# Patient Record
Sex: Female | Born: 1960 | Race: Black or African American | Hispanic: No | Marital: Single | Smoking: Never smoker
Health system: Southern US, Community
[De-identification: ages and names within clinical notes are randomized; demographics above are authoritative.]

## PROBLEM LIST (undated history)

## (undated) DIAGNOSIS — E079 Disorder of thyroid, unspecified: Secondary | ICD-10-CM

## (undated) DIAGNOSIS — R202 Paresthesia of skin: Principal | ICD-10-CM

## (undated) DIAGNOSIS — R200 Anesthesia of skin: Principal | ICD-10-CM

## (undated) HISTORY — DX: Paresthesia of skin: R20.2

## (undated) HISTORY — PX: ABDOMINAL HYSTERECTOMY: SUR658

## (undated) HISTORY — PX: THYROIDECTOMY: SHX17

## (undated) HISTORY — DX: Anesthesia of skin: R20.0

---

## 1998-01-18 ENCOUNTER — Ambulatory Visit (HOSPITAL_COMMUNITY): Admission: RE | Admit: 1998-01-18 | Discharge: 1998-01-18 | Payer: Self-pay | Admitting: Obstetrics and Gynecology

## 1998-01-18 ENCOUNTER — Encounter: Payer: Self-pay | Admitting: Obstetrics and Gynecology

## 1998-10-10 ENCOUNTER — Ambulatory Visit (HOSPITAL_COMMUNITY): Admission: RE | Admit: 1998-10-10 | Discharge: 1998-10-10 | Payer: Self-pay | Admitting: Internal Medicine

## 1998-10-11 ENCOUNTER — Encounter: Payer: Self-pay | Admitting: Internal Medicine

## 2001-09-15 ENCOUNTER — Encounter: Payer: Self-pay | Admitting: Obstetrics and Gynecology

## 2001-09-15 ENCOUNTER — Ambulatory Visit (HOSPITAL_COMMUNITY): Admission: RE | Admit: 2001-09-15 | Discharge: 2001-09-15 | Payer: Self-pay | Admitting: Obstetrics and Gynecology

## 2002-01-13 ENCOUNTER — Ambulatory Visit (HOSPITAL_COMMUNITY): Admission: RE | Admit: 2002-01-13 | Discharge: 2002-01-13 | Payer: Self-pay | Admitting: Obstetrics and Gynecology

## 2002-01-13 ENCOUNTER — Encounter: Payer: Self-pay | Admitting: Obstetrics and Gynecology

## 2002-12-10 ENCOUNTER — Other Ambulatory Visit: Admission: RE | Admit: 2002-12-10 | Discharge: 2002-12-10 | Payer: Self-pay | Admitting: Internal Medicine

## 2002-12-16 ENCOUNTER — Encounter: Payer: Self-pay | Admitting: Obstetrics and Gynecology

## 2002-12-16 ENCOUNTER — Ambulatory Visit (HOSPITAL_COMMUNITY): Admission: RE | Admit: 2002-12-16 | Discharge: 2002-12-16 | Payer: Self-pay | Admitting: Obstetrics and Gynecology

## 2003-01-07 ENCOUNTER — Ambulatory Visit (HOSPITAL_BASED_OUTPATIENT_CLINIC_OR_DEPARTMENT_OTHER): Admission: RE | Admit: 2003-01-07 | Discharge: 2003-01-07 | Payer: Self-pay | Admitting: Obstetrics and Gynecology

## 2013-12-09 ENCOUNTER — Other Ambulatory Visit: Payer: Self-pay | Admitting: Family Medicine

## 2013-12-09 DIAGNOSIS — E042 Nontoxic multinodular goiter: Secondary | ICD-10-CM

## 2013-12-20 ENCOUNTER — Ambulatory Visit
Admission: RE | Admit: 2013-12-20 | Discharge: 2013-12-20 | Disposition: A | Discharge status: Home / Self Care | Payer: 59 | Source: Ambulatory Visit | Attending: Family Medicine | Admitting: Family Medicine

## 2013-12-20 DIAGNOSIS — E042 Nontoxic multinodular goiter: Secondary | ICD-10-CM

## 2014-01-04 ENCOUNTER — Other Ambulatory Visit: Payer: Self-pay | Admitting: Family Medicine

## 2014-01-04 DIAGNOSIS — E041 Nontoxic single thyroid nodule: Secondary | ICD-10-CM

## 2014-01-13 ENCOUNTER — Encounter (INDEPENDENT_AMBULATORY_CARE_PROVIDER_SITE_OTHER): Payer: Self-pay

## 2014-01-13 ENCOUNTER — Other Ambulatory Visit (HOSPITAL_COMMUNITY)
Admission: RE | Admit: 2014-01-13 | Discharge: 2014-01-13 | Disposition: A | Discharge status: Home / Self Care | Payer: 59 | Source: Ambulatory Visit | Attending: Interventional Radiology | Admitting: Interventional Radiology

## 2014-01-13 ENCOUNTER — Ambulatory Visit
Admission: RE | Admit: 2014-01-13 | Discharge: 2014-01-13 | Disposition: A | Discharge status: Home / Self Care | Payer: 59 | Source: Ambulatory Visit | Attending: Family Medicine | Admitting: Family Medicine

## 2014-01-13 DIAGNOSIS — E041 Nontoxic single thyroid nodule: Secondary | ICD-10-CM | POA: Diagnosis not present

## 2014-04-19 ENCOUNTER — Other Ambulatory Visit: Payer: Self-pay | Admitting: Otolaryngology

## 2014-04-19 DIAGNOSIS — R4702 Dysphasia: Secondary | ICD-10-CM

## 2014-04-21 ENCOUNTER — Ambulatory Visit
Admission: RE | Admit: 2014-04-21 | Discharge: 2014-04-21 | Disposition: A | Discharge status: Home / Self Care | Payer: 59 | Source: Ambulatory Visit | Attending: Otolaryngology | Admitting: Otolaryngology

## 2014-04-21 DIAGNOSIS — R4702 Dysphasia: Secondary | ICD-10-CM

## 2014-05-12 ENCOUNTER — Other Ambulatory Visit: Payer: Self-pay | Admitting: Otolaryngology

## 2014-05-12 DIAGNOSIS — E049 Nontoxic goiter, unspecified: Secondary | ICD-10-CM

## 2014-12-16 ENCOUNTER — Emergency Department (HOSPITAL_COMMUNITY)
Admission: EM | Admit: 2014-12-16 | Discharge: 2014-12-17 | Disposition: A | Discharge status: Home / Self Care | Payer: Commercial Managed Care - HMO | Attending: Emergency Medicine | Admitting: Emergency Medicine

## 2014-12-16 ENCOUNTER — Emergency Department (HOSPITAL_COMMUNITY): Payer: Commercial Managed Care - HMO

## 2014-12-16 ENCOUNTER — Encounter (HOSPITAL_COMMUNITY): Payer: Self-pay | Admitting: *Deleted

## 2014-12-16 DIAGNOSIS — Z880 Allergy status to penicillin: Secondary | ICD-10-CM | POA: Diagnosis not present

## 2014-12-16 DIAGNOSIS — R200 Anesthesia of skin: Secondary | ICD-10-CM | POA: Diagnosis present

## 2014-12-16 DIAGNOSIS — Z8639 Personal history of other endocrine, nutritional and metabolic disease: Secondary | ICD-10-CM | POA: Diagnosis not present

## 2014-12-16 DIAGNOSIS — G510 Bell's palsy: Secondary | ICD-10-CM | POA: Insufficient documentation

## 2014-12-16 HISTORY — DX: Disorder of thyroid, unspecified: E07.9

## 2014-12-16 LAB — DIFFERENTIAL
BASOS PCT: 0 % (ref 0–1)
Basophils Absolute: 0 10*3/uL (ref 0.0–0.1)
EOS ABS: 0 10*3/uL (ref 0.0–0.7)
EOS PCT: 0 % (ref 0–5)
Lymphocytes Relative: 31 % (ref 12–46)
Lymphs Abs: 3 10*3/uL (ref 0.7–4.0)
Monocytes Absolute: 0.5 10*3/uL (ref 0.1–1.0)
Monocytes Relative: 6 % (ref 3–12)
Neutro Abs: 6 10*3/uL (ref 1.7–7.7)
Neutrophils Relative %: 63 % (ref 43–77)

## 2014-12-16 LAB — COMPREHENSIVE METABOLIC PANEL
ALBUMIN: 3.8 g/dL (ref 3.5–5.0)
ALT: 17 U/L (ref 14–54)
ANION GAP: 7 (ref 5–15)
AST: 17 U/L (ref 15–41)
Alkaline Phosphatase: 48 U/L (ref 38–126)
BILIRUBIN TOTAL: 0.6 mg/dL (ref 0.3–1.2)
BUN: 11 mg/dL (ref 6–20)
CO2: 27 mmol/L (ref 22–32)
Calcium: 9.1 mg/dL (ref 8.9–10.3)
Chloride: 106 mmol/L (ref 101–111)
Creatinine, Ser: 1.33 mg/dL — ABNORMAL HIGH (ref 0.44–1.00)
GFR calc non Af Amer: 45 mL/min — ABNORMAL LOW (ref >60)
GFR, EST AFRICAN AMERICAN: 52 mL/min — AB (ref >60)
GLUCOSE: 103 mg/dL — AB (ref 65–99)
POTASSIUM: 3.9 mmol/L (ref 3.5–5.1)
Sodium: 140 mmol/L (ref 135–145)
Total Protein: 6.7 g/dL (ref 6.5–8.1)

## 2014-12-16 LAB — CBC
HEMATOCRIT: 44.2 % (ref 36.0–46.0)
HEMOGLOBIN: 14.6 g/dL (ref 12.0–15.0)
MCH: 28.5 pg (ref 26.0–34.0)
MCHC: 33 g/dL (ref 30.0–36.0)
MCV: 86.2 fL (ref 78.0–100.0)
PLATELETS: 275 10*3/uL (ref 150–400)
RBC: 5.13 MIL/uL — AB (ref 3.87–5.11)
RDW: 14.6 % (ref 11.5–15.5)
WBC: 9.5 10*3/uL (ref 4.0–10.5)

## 2014-12-16 LAB — I-STAT TROPONIN, ED: Troponin i, poc: 0 ng/mL (ref 0.00–0.08)

## 2014-12-16 MED ORDER — VALACYCLOVIR HCL 1 G PO TABS: 1000.0000 mg | ORAL_TABLET | Freq: Three times a day (TID) | ORAL | Status: DC

## 2014-12-16 MED ORDER — VALACYCLOVIR HCL 500 MG PO TABS
1000.0000 mg | ORAL_TABLET | Freq: Once | ORAL | Status: AC
  Administered 2014-12-16: 1000 mg via ORAL
  Filled 2014-12-16: qty 2

## 2014-12-16 NOTE — ED Notes (Signed)
Patient returned from MRI.

## 2014-12-16 NOTE — ED Notes (Signed)
Pt refused IV at this time

## 2014-12-16 NOTE — ED Provider Notes (Signed)
CSN: [id number]     Arrival date & time 12/16/14  1548 History   First MD Initiated Contact with Patient 12/16/14 2009     Chief Complaint  Patient presents with  . Numbness     (Consider location/radiation/quality/duration/timing/severity/associated sxs/prior Treatment) HPI   54 year old female with history of thyroid disease presents for evaluation of facial numbness. Patient was sent here from [place] at [facility]. Patient report 10 days ago after eating a peanut sauce patient notice small bumps and discomfort on her tongue and the right side of her lip. She thought it may be an allergic reaction. Patient was seen at an outside clinic and received prednisone 20 mg for 4 days plus Depo-Medrol 80 mg injection. Symptoms did not improve and this morning she was having some trouble swallowing with increase R side lip swelling and numbness sensation to the right side of her tongue and R cheek.  The symptom has been persistent but not worsening. She feels some improvement. She denies having any fever, chills, headache, double vision, hearing changes, facial droop, difficulty thinking, neck stiffness, chest pain, trouble breathing, abdominal pain, dysuria, focal numbness or weakness aside from her facial discomfort. She is not a drinker or smoker. Remote history of penicillin allergy. No other environmental changes. No history of prior stroke. She had her thyroid removed last January and currently on levothyroxine.    Past Medical History  Diagnosis Date  . Thyroid disease    History reviewed. No pertinent past surgical history. History reviewed. No pertinent family history. History  Substance Use Topics  . Smoking status: Not on file  . Smokeless tobacco: Not on file  . Alcohol Use: No   OB History    No data available     Review of Systems  All other systems reviewed and are negative.     Allergies  Penicillins  Home Medications   Prior to Admission medications   Not on  File   BP 131/84 mmHg  Pulse 75  Temp(Src) 97.9 F (36.6 C) (Oral)  Resp 20  SpO2 97% Physical Exam  Constitutional: She is oriented to person, place, and time. She appears well-developed and well-nourished. No distress.  HENT:  Head: Atraumatic.  Right Ear: External ear normal.  Left Ear: External ear normal.  Nose: Nose normal.  Mouth/Throat: Oropharynx is clear and moist. No oropharyngeal exudate.  Eyes: Conjunctivae and EOM are normal. Pupils are equal, round, and reactive to light.  Neck: Normal range of motion. Neck supple. No thyromegaly present.  No nuchal rigidity.  Cardiovascular: Normal rate and regular rhythm.   Pulmonary/Chest: Effort normal and breath sounds normal. No stridor.  Abdominal: Soft. Bowel sounds are normal.  Lymphadenopathy:    She has no cervical adenopathy.  Neurological: She is alert and oriented to person, place, and time.  Neurologic exam:  Speech clear, pupils equal round reactive to light, extraocular movements intact  Normal peripheral visual fields Cranial nerves III through XII normal including no facial droop.  Subjective decreased sensation to R cheek compare to left. Follows commands, moves all extremities x4, normal strength to bilateral upper and lower extremities at all major muscle groups including grip Sensation normal to light touch  Coordination intact, no limb ataxia, finger-nose-finger normal Rapid alternating movements normal No pronator drift Gait normal   Skin: No rash noted.  Psychiatric: She has a normal mood and affect.  Nursing note and vitals reviewed.   ED Course  Procedures (including critical care time)  Patient complain of  subjective mild paresthesia to her right cheek and right side of tongue, last normal last night. No other focal neuro deficit on exam. Low risk factors concerning for stroke. No signs to suggest angioedema or allergic reaction on exam. Patient is mentating appropriately. Plan to obtain brain  MRI to rule out stroke.  10:28 PM Labs are reassuring.  Mild renal insufficiency with Cr 1.33.  Brain MRI without acute abnormalities concerning for acute stroke.  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  I discussed care with Dr. Manus Gunning who have also evaluated pt.  We suspect early Bell's palsy.  Since pt has been on steroid for the past 10 days, plan to prescribe valtrex 1,000mg  PO TID x 10 days.  Pt to f/u with PCP.  Return precaution discussed.    Labs Review Labs Reviewed  CBC - Abnormal; Notable for the following:    RBC 5.13 (*)    All other components within normal limits  COMPREHENSIVE METABOLIC PANEL - Abnormal; Notable for the following:    Glucose, Bld 103 (*)    Creatinine, Ser 1.33 (*)    GFR calc non Af Amer 45 (*)    GFR calc Af Amer 52 (*)    All other components within normal limits  DIFFERENTIAL  Rosezena Sensor, ED    Imaging Review Mr Brain Wo Contrast  12/16/2014   CLINICAL DATA:  RIGHT facial paresthesias. Recent allergic reaction treated with prednisone. Intramuscular injection received 2 days ago. No neuro deficits.  EXAM: MRI HEAD WITHOUT CONTRAST  TECHNIQUE: Multiplanar, multiecho pulse sequences of the brain and surrounding structures were obtained without intravenous contrast.  COMPARISON:  None.  FINDINGS: The ventricles and sulci are normal for patient's age. No abnormal parenchymal signal, mass lesions, mass effect. No reduced diffusion to suggest acute ischemia. Multiple small LEFT greater than RIGHT old cerebellar infarcts. A few supratentorial subcentimeter white matter T2 hyperintensities. No susceptibility artifact to suggest hemorrhage.  No abnormal extra-axial fluid collections. No extra-axial masses though, contrast enhanced sequences would be more sensitive. Normal major intracranial vascular flow voids seen at the skull base.  Ocular globes and orbital contents are unremarkable though not tailored for evaluation. No abnormal sellar  expansion. Visualized paranasal sinuses and mastoid air cells are well-aerated. No suspicious calvarial bone marrow signal. No abnormal sellar expansion. Craniocervical junction maintained.  IMPRESSION: No acute intracranial process.  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 Mild white matter changes compatible with chronic small vessel ischemic disease.   Electronically Signed   By: Awilda Metro M.D.   On: 12/16/2014 22:14     EKG Interpretation   Date/Time:  Friday December 16 2014 16:24:58 EDT Ventricular Rate:  69 PR Interval:  164 QRS Duration: 84 QT Interval:  372 QTC Calculation: 398 R Axis:   83 Text Interpretation:  Normal sinus rhythm Nonspecific T wave abnormality  Abnormal ECG No previous ECGs available Confirmed by Manus Gunning  MD, STEPHEN  [phone]) on 12/16/2014 10:27:02 PM      MDM   Final diagnoses:  Paresis of one side of face    BP 131/89 mmHg  Pulse 73  Temp(Src) 97.7 F (36.5 C) (Oral)  Resp 16  SpO2 98%  I have reviewed nursing notes and vital signs. I personally viewed the imaging tests through PACS system and agrees with radiologist's intepretation I reviewed available ER/hospitalization records through the EMR     Fayrene Helper, PA-C 12/16/14 2327  Glynn Octave, MD 12/17/14 [phone]

## 2014-12-16 NOTE — Discharge Instructions (Signed)
Your facial numbness may be signs of early Bell's palsy.  Please take valtrex three times daily for the next 10 days.  Return to ER if your condition worsen or if you have other concerns.  Otherwise please follow up with your doctor.  Bell's Palsy Bell's palsy is a condition in which the muscles on one side of the face cannot move (paralysis). This is because the nerves in the face are paralyzed. It is most often thought to be caused by a virus. The virus causes swelling of the nerve that controls movement on one side of the face. The nerve travels through a tight space surrounded by bone. When the nerve swells, it can be compressed by the bone. This results in damage to the protective covering around the nerve. This damage interferes with how the nerve communicates with the muscles of the face. As a result, it can cause weakness or paralysis of the facial muscles.  Injury (trauma), tumor, and surgery may cause Bell's palsy, but most of the time the cause is unknown. It is a relatively common condition. It starts suddenly (abrupt onset) with the paralysis usually ending within 2 days. Bell's palsy is not dangerous. But because the eye does not close properly, you may need care to keep the eye from getting dry. This can include splinting (to keep the eye shut) or moistening with artificial tears. Bell's palsy very seldom occurs on both sides of the face at the same time. SYMPTOMS   Eyebrow sagging.  Drooping of the eyelid and corner of the mouth.  Inability to close one eye.  Loss of taste on the front of the tongue.  Sensitivity to loud noises. TREATMENT  The treatment is usually non-surgical. If the patient is seen within the first 24 to 48 hours, a short course of steroids may be prescribed, in an attempt to shorten the length of the condition. Antiviral medicines may also be used with the steroids, but it is unclear if they are helpful.  You will need to protect your eye, if you cannot close it.  The cornea (clear covering over your eye) will become dry and can be damaged. Artificial tears can be used to keep your eye moist. Glasses or an eye patch should be worn to protect your eye. PROGNOSIS  Recovery is variable, ranging from days to months. Although the problem usually goes away completely (about 80% of cases resolve), predicting the outcome is impossible. Most people improve within 3 weeks of when the symptoms began. Improvement may continue for 3 to 6 months. A small number of people have moderate to severe weakness that is permanent.  HOME CARE INSTRUCTIONS   If your caregiver prescribed medication to reduce swelling in the nerve, use as directed. Do not stop taking the medication unless directed by your caregiver.  Use moisturizing eye drops as needed to prevent drying of your eye, as directed by your caregiver.  Protect your eye, as directed by your caregiver.  Use facial massage and exercises, as directed by your caregiver.  Perform your normal activities, and get your normal rest. SEEK IMMEDIATE MEDICAL CARE IF:   There is pain, redness or irritation in the eye.  You or your child has an oral temperature above 102 F (38.9 C), not controlled by medicine. MAKE SURE YOU:   Understand these instructions.  Will watch your condition.  Will get help right away if you are not doing well or get worse. Document Released: 04/29/2005 Document Revised: 07/22/2011 Document Reviewed:  08/06/2013 ExitCare Patient Information 2015 [place], [place]. This information is not intended to replace advice given to you by your health care provider. Make sure you discuss any questions you have with your health care provider.

## 2014-12-16 NOTE — ED Notes (Signed)
Pt reports recent allergic reaction and has been treated with prednisone and had [facility]. Today woke up with right side facial numbness. Pt has no neuro deficits at triage, no facial droop, grips ar equal, speech is clear.

## 2014-12-30 ENCOUNTER — Encounter: Payer: Self-pay | Admitting: Neurology

## 2014-12-30 ENCOUNTER — Ambulatory Visit (INDEPENDENT_AMBULATORY_CARE_PROVIDER_SITE_OTHER): Payer: Commercial Managed Care - HMO | Admitting: Neurology

## 2014-12-30 VITALS — BP 121/84 | HR 75 | Ht 66.0 in | Wt 178.0 lb

## 2014-12-30 DIAGNOSIS — R200 Anesthesia of skin: Secondary | ICD-10-CM

## 2014-12-30 DIAGNOSIS — R202 Paresthesia of skin: Secondary | ICD-10-CM | POA: Diagnosis not present

## 2014-12-30 HISTORY — DX: Anesthesia of skin: R20.2

## 2014-12-30 HISTORY — DX: Anesthesia of skin: R20.0

## 2014-12-30 NOTE — Progress Notes (Signed)
Reason for visit: Facial numbness  Referring physician: Caney  Elizabeth Kent is a 54 y.o. female  History of present illness:  Elizabeth Kent is a 54 year old right-handed black female with a history of food allergies. On 12/08/2014 she was eating peanuts, and she noted some sensation of numbness on the right cheek and lip. She has developed a sensation of swelling on the right cheek, and some painful swellings on the right tongue. As time has gone on, this has generalized to include both sides. The patient did go to the emergency room, MRI the brain was done and showed no acute changes. The patient has not noted any other symptoms of numbness or weakness of the extremities, she denies any gait disturbance or difficulty controlling the bowels or the bladder. The patient will have occasional headache. The patient has had some chills. She has a sensation that it is somewhat difficult to swallow, she denies any swollen lymph nodes. She has not had any skin rashes on the hands, feet, or body. The patient is sent to this office for an evaluation.  Past Medical History  Diagnosis Date  . Thyroid disease   . Numbness and tingling 12/30/2014    Past Surgical History  Procedure Laterality Date  . Thyroidectomy    . Abdominal hysterectomy      Family History  Problem Relation Age of Onset  . Hypertension Mother   . Kidney disease Mother     dialysis  . Cancer Father   . Hypertension Maternal Grandmother     Social history:  reports that she has never smoked. She has never used smokeless tobacco. She reports that she drinks alcohol. She reports that she does not use illicit drugs.  Medications:  Prior to Admission medications   Medication Sig Start Date End Date Taking? Authorizing Provider  diphenhydrAMINE (BENADRYL) 25 mg capsule Take 25 mg by mouth every 6 (six) hours as needed.   Yes Historical Provider, MD  EPINEPHrine 0.3 mg/0.3 mL IJ SOAJ injection Inject 0.3 mg into the muscle  once.   Yes Historical Provider, MD  levocetirizine (XYZAL) 5 MG tablet Take 5 mg by mouth daily. 12/14/14  Yes Historical Provider, MD  levothyroxine (SYNTHROID, LEVOTHROID) 112 MCG tablet Take 112 mcg by mouth daily. 12/16/14  Yes Historical Provider, MD      Allergies  Allergen Reactions  . Other     Nuts   . Penicillins   . Shellfish Allergy     ROS:  Out of a complete 14 system review of symptoms, the patient complains only of the following symptoms, and all other reviewed systems are negative.  Chills Palpitations of the heart Ringing of the ears, difficulty swallowing Increased thirst Food allergies Numbness, difficulty swallowing  Blood pressure 121/84, pulse 75, height 5\' 6"  (1.676 m), weight 178 lb (80.74 kg).  Physical Exam  General: The patient is alert and cooperative at the time of the examination.  Eyes: Pupils are equal, round, and reactive to light. Discs are flat bilaterally.  Ears: Tympanic membranes are clear bilaterally  Throat: No redness, erythema, or coating on the tongue is noted. No enlarged lymph nodes are noted on the neck.  Neck: The neck is supple, no carotid bruits are noted.  Respiratory: The respiratory examination is clear.  Cardiovascular: The cardiovascular examination reveals a regular rate and rhythm, no obvious murmurs or rubs are noted.  Skin: Extremities are without significant edema.  Neurologic Exam  Mental status: The patient is  alert and oriented x 3 at the time of the examination. The patient has apparent normal recent and remote memory, with an apparently normal attention span and concentration ability.  Cranial nerves: Facial symmetry is present. There is good sensation of the face to pinprick and soft touch bilaterally. The strength of the facial muscles and the muscles to head turning and shoulder shrug are normal bilaterally. Speech is well enunciated, no aphasia or dysarthria is noted. Extraocular movements are full.  Visual fields are full. The tongue is midline, and the patient has symmetric elevation of the soft palate. No obvious hearing deficits are noted.  Motor: The motor testing reveals 5 over 5 strength of all 4 extremities. Good symmetric motor tone is noted throughout.  Sensory: Sensory testing is intact to pinprick, soft touch, vibration sensation, and position sense on all 4 extremities. No evidence of extinction is noted.  Coordination: Cerebellar testing reveals good finger-nose-finger and heel-to-shin bilaterally.  Gait and station: Gait is normal. Tandem gait is normal. Romberg is negative. No drift is seen.  Reflexes: Deep tendon reflexes are symmetric and normal bilaterally. Toes are downgoing bilaterally.   MRI brain 12/16/14:  IMPRESSION: No acute intracranial process.  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 Mild white matter changes compatible with chronic small vessel ischemic disease.  * MRI scan images were reviewed online. I agree with the written report.    Assessment/Plan:  1. Reported facial numbness  The patient at this point has had resolution of the numbness sensation, she mainly complains of painful nodules on the tongue, and sensation of swelling of the cheeks. The patient may have a viral syndrome, possibly with a coxsackievirus. The patient be sent for blood work today, she will follow-up through this office on an as-needed basis. I see no evidence of a Bell's palsy or other primary neurologic disease.  Marlan Palau MD 01/01/2015 3:28 PM  [facility] [address] [place], [place] [address]  Phone [phone] Fax [phone]

## 2015-01-01 LAB — SEDIMENTATION RATE: Sed Rate: 2 mm/hr (ref 0–40)

## 2015-01-01 LAB — ANA W/REFLEX: Anti Nuclear Antibody(ANA): NEGATIVE

## 2015-01-01 LAB — ANGIOTENSIN CONVERTING ENZYME: Angio Convert Enzyme: 28 U/L (ref 14–82)

## 2015-01-02 ENCOUNTER — Telehealth: Payer: Self-pay

## 2015-01-02 NOTE — Telephone Encounter (Signed)
I called the patient and relayed results. 

## 2015-01-02 NOTE — Telephone Encounter (Signed)
-----   Message from York Spaniel, MD sent at 01/01/2015  3:01 PM EDT -----  The blood work results are unremarkable. Please call the patient.  ----- Message -----    From: [facility] Lab Results In Interface    Sent: 12/31/2014   7:43 AM      To: York Spaniel, MD

## 2017-02-17 IMAGING — MR MR HEAD W/O CM
9 of 10 series · 34 of 48 positions shown · non-contrast
Comparison: None.

CLINICAL DATA: RIGHT facial paresthesias. Recent allergic reaction
treated with prednisone. Intramuscular injection received 2 days
ago. No neuro deficits.

EXAM:
MRI HEAD WITHOUT CONTRAST
TECHNIQUE: Multiplanar, multiecho pulse sequences of the brain and surrounding
structures were obtained without intravenous contrast.

[Series 2: FLAIR · sagittal · 5.0mm · 0.47mm/px · 3 of 23 slices shown (1 of 2)]
[im 1/23]
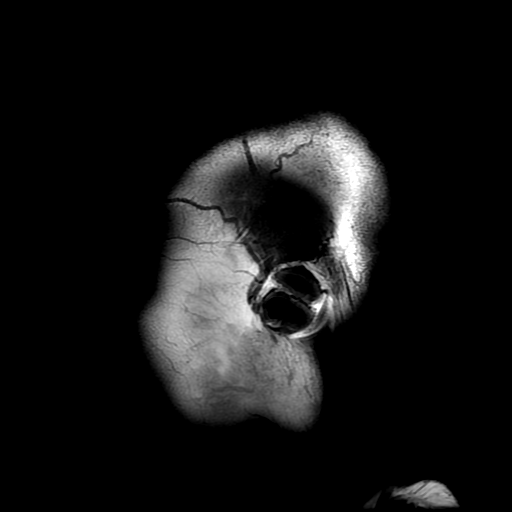
[im 12/23]
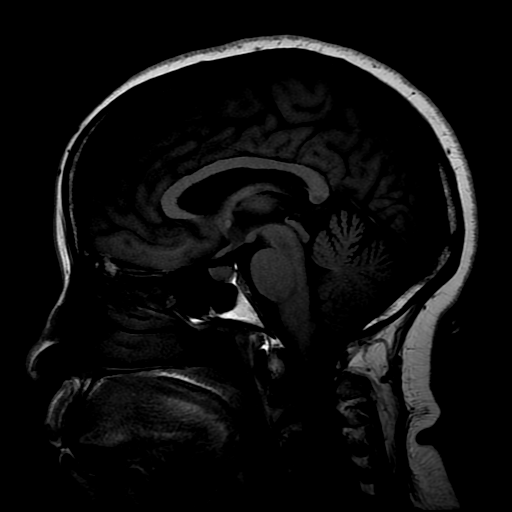
[im 23/23]
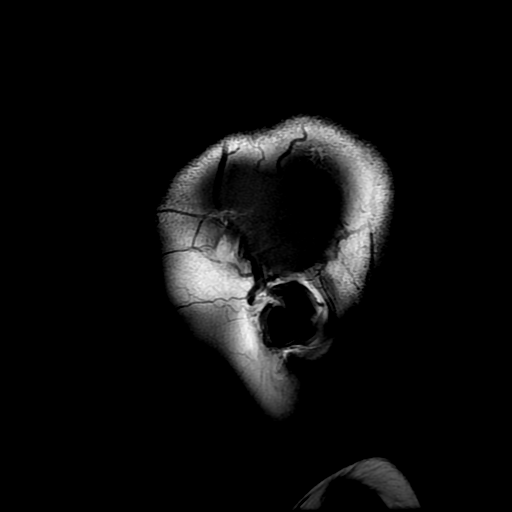

[Series 4: DWI · axial · 3.0mm · 0.94mm/px · z∈[-118,+24]mm · 9 of 99 slices shown (1 of 4)]
[im 1/99]
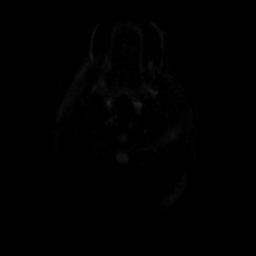
[im 13/99]
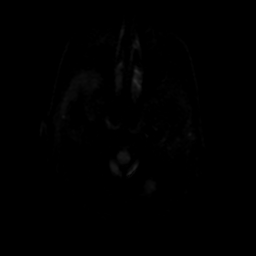
[im 25/99]
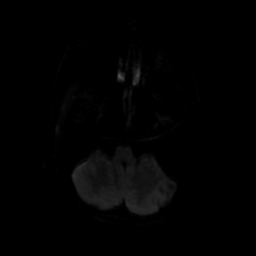
[im 37/99]
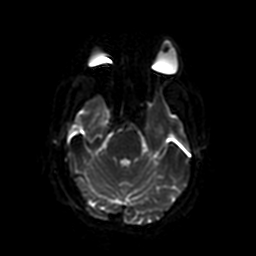
[im 50/99]
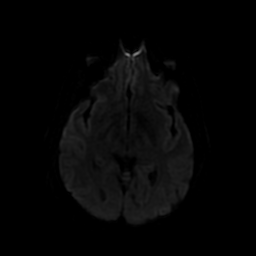
[im 62/99]
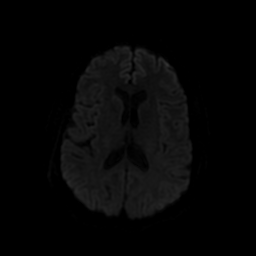
[im 74/99]
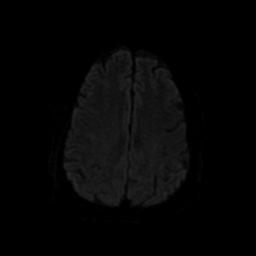
[im 86/99]
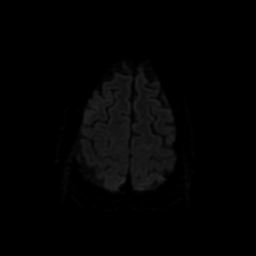
[im 99/99]
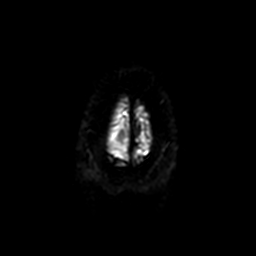

[Series 5: T2 · axial · 5.0mm · 0.47mm/px · z∈[-116,+23]mm · 2 of 25 slices shown (1 of 2)]
[im 1/25]
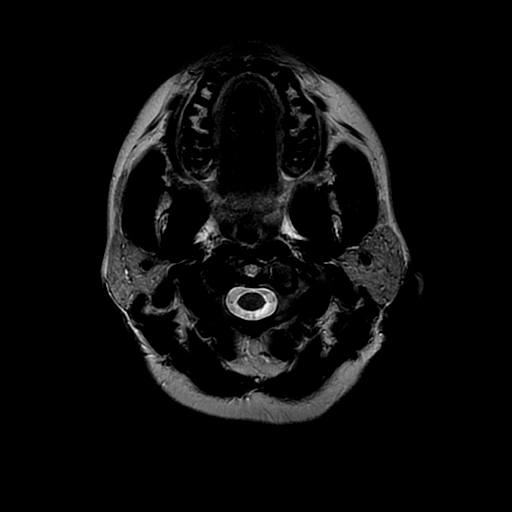
[im 25/25]
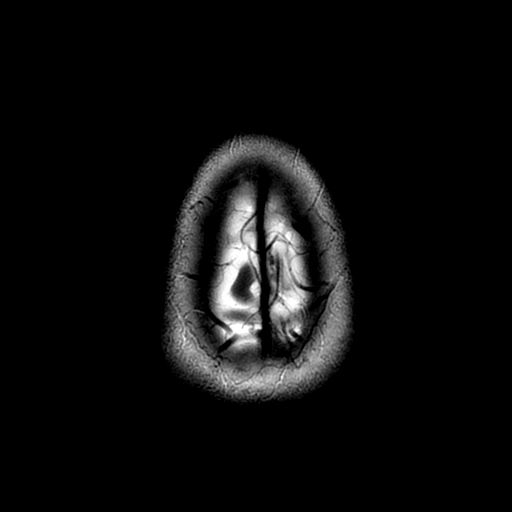

[Series 6: DWI · coronal · 5.0mm · 0.94mm/px · 6 of 72 slices shown (2 of 4)]
[im 1/72]
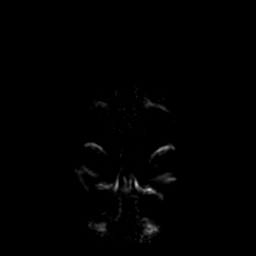
[im 15/72]
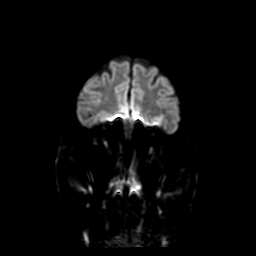
[im 29/72]
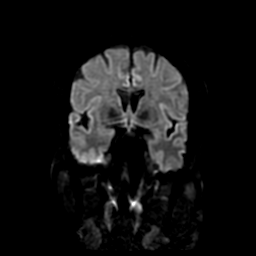
[im 43/72]
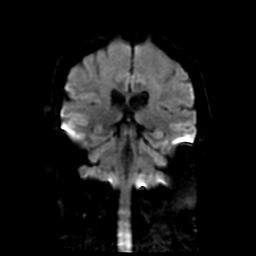
[im 57/72]
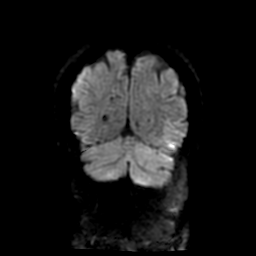
[im 72/72]
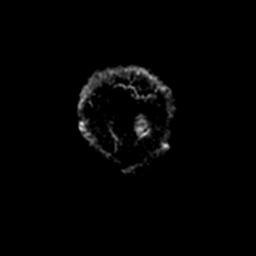

[Series 7: FLAIR · axial · 5.0mm · 0.47mm/px · z∈[-116,+23]mm · 2 of 25 slices shown (2 of 2)]
[im 1/25]
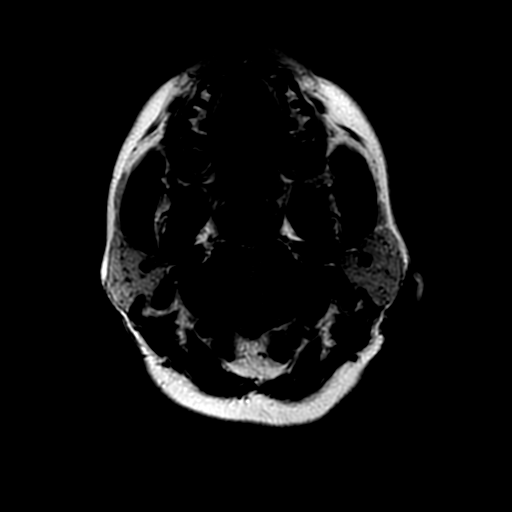
[im 25/25]
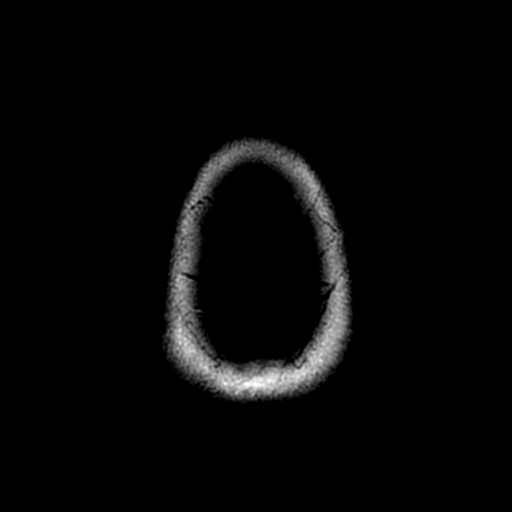

[Series 8: (person_name) · axial · 3.0mm · 0.47mm/px · z∈[-118,-100]mm · 2 of 100 slices shown]
[im 1/100]
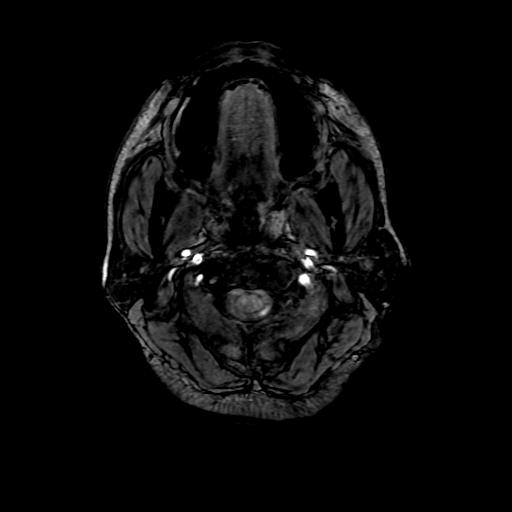
[im 13/100]
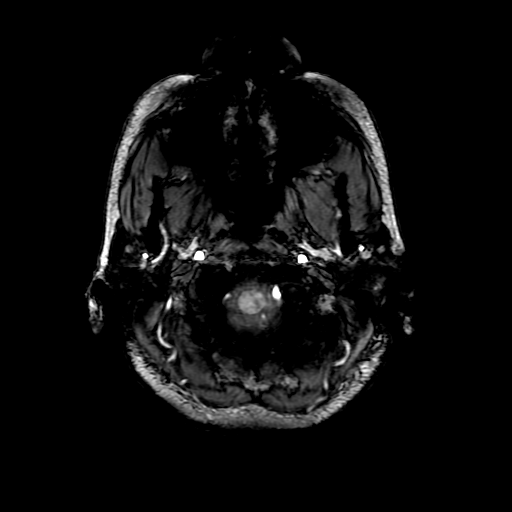

[Series 10: T2 · coronal · 5.0mm · 0.47mm/px · 3 of 30 slices shown (2 of 2)]
[im 1/30]
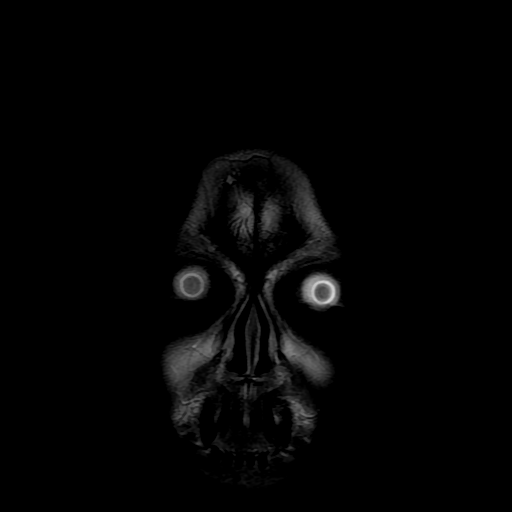
[im 15/30]
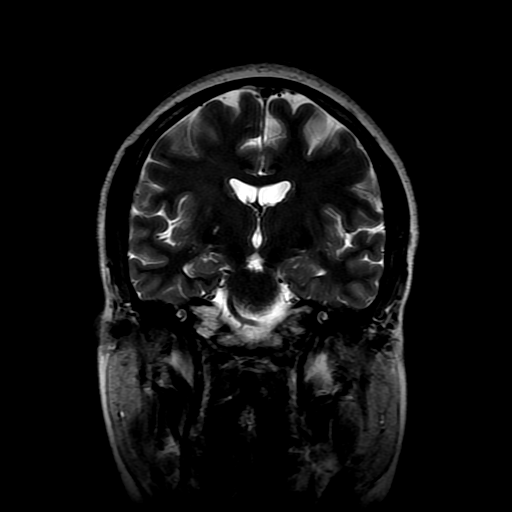
[im 30/30]
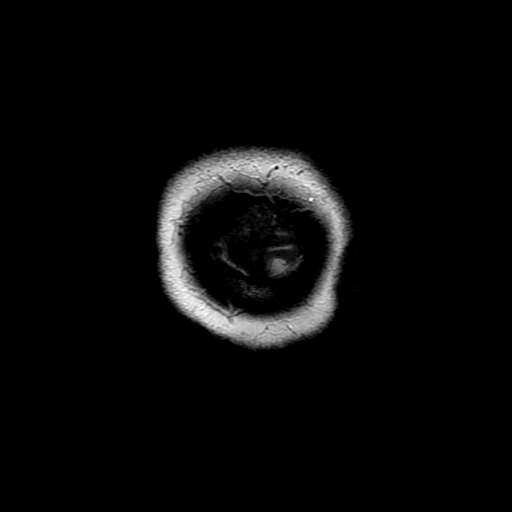

[Series 400: DWI · axial · 3.0mm · 0.94mm/px · z∈[-118,+24]mm · 4 of 50 slices shown (3 of 4)]
[im 1/50]
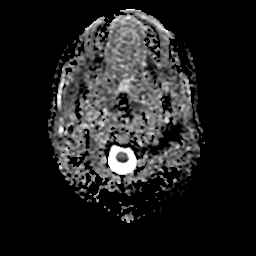
[im 17/50]
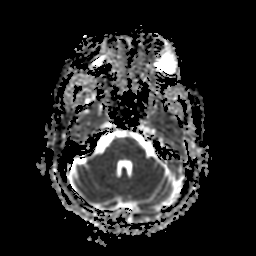
[im 33/50]
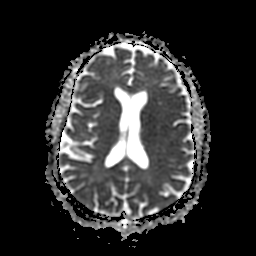
[im 50/50]
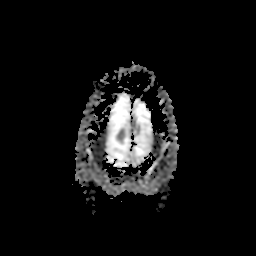

[Series 600: DWI · coronal · 5.0mm · 0.94mm/px · 3 of 36 slices shown (4 of 4)]
[im 1/36]
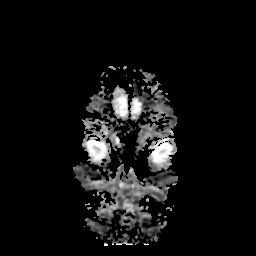
[im 18/36]
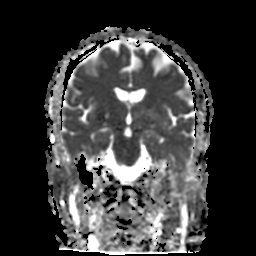
[im 36/36]
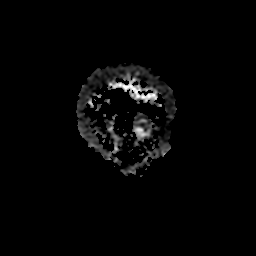

[34 of 48 positions shown; findings below may reference images not displayed]

FINDINGS: The ventricles and sulci are normal for patient's age. No abnormal
parenchymal signal, mass lesions, mass effect. No reduced diffusion
to suggest acute ischemia. Multiple small LEFT greater than RIGHT
old cerebellar infarcts. A few supratentorial subcentimeter white
matter T2 hyperintensities. No susceptibility artifact to suggest
hemorrhage.

No abnormal extra-axial fluid collections. No extra-axial masses
though, contrast enhanced sequences would be more sensitive. Normal
major intracranial vascular flow voids seen at the skull base.

Ocular globes and orbital contents are unremarkable though not
tailored for evaluation. No abnormal sellar expansion. Visualized
paranasal sinuses and mastoid air cells are well-aerated. No
suspicious calvarial bone marrow signal. No abnormal sellar
expansion. Craniocervical junction maintained.
IMPRESSION: No acute intracranial process.

Multiple small old bilateral cerebellar infarcts. Mild white matter
changes compatible with chronic small vessel ischemic disease.

## 2017-04-16 ENCOUNTER — Telehealth (HOSPITAL_COMMUNITY): Payer: Self-pay | Admitting: Endocrinology

## 2017-04-17 ENCOUNTER — Other Ambulatory Visit: Payer: Self-pay | Admitting: Endocrinology

## 2017-04-17 DIAGNOSIS — I639 Cerebral infarction, unspecified: Secondary | ICD-10-CM

## 2017-04-17 NOTE — Telephone Encounter (Signed)
User: Elizabeth Kent, Elizabeth Kent Date/time: 04/16/17 3:39 PM  Comment: Called pt and lmsg for her to CB to get scheduled for test.  Context:  Outcome: Left Message  Phone number: [phone] Phone Type: Home Phone  Comm. type: Telephone Call type: Outgoing  Contact: Wyatt Kent, Elizabeth S Relation to patient: Self

## 2017-04-22 ENCOUNTER — Encounter (HOSPITAL_COMMUNITY): Payer: Commercial Managed Care - HMO

## 2017-04-24 ENCOUNTER — Other Ambulatory Visit (HOSPITAL_COMMUNITY): Payer: Commercial Managed Care - HMO

## 2017-04-25 ENCOUNTER — Ambulatory Visit (HOSPITAL_COMMUNITY)
Admission: RE | Admit: 2017-04-25 | Discharge: 2017-04-25 | Disposition: A | Discharge status: Home / Self Care | Payer: 59 | Source: Ambulatory Visit | Attending: Cardiovascular Disease | Admitting: Cardiovascular Disease

## 2017-04-25 DIAGNOSIS — I639 Cerebral infarction, unspecified: Secondary | ICD-10-CM

## 2017-05-02 ENCOUNTER — Encounter: Payer: Self-pay | Admitting: Neurology

## 2017-05-16 ENCOUNTER — Other Ambulatory Visit (HOSPITAL_COMMUNITY): Payer: 59

## 2017-05-23 ENCOUNTER — Ambulatory Visit (HOSPITAL_COMMUNITY): Payer: 59 | Attending: Endocrinology

## 2017-05-23 ENCOUNTER — Other Ambulatory Visit: Payer: Self-pay

## 2017-05-23 DIAGNOSIS — Z8249 Family history of ischemic heart disease and other diseases of the circulatory system: Secondary | ICD-10-CM | POA: Insufficient documentation

## 2017-05-23 DIAGNOSIS — I639 Cerebral infarction, unspecified: Secondary | ICD-10-CM

## 2017-05-23 DIAGNOSIS — E785 Hyperlipidemia, unspecified: Secondary | ICD-10-CM | POA: Diagnosis not present

## 2017-05-31 ENCOUNTER — Other Ambulatory Visit: Payer: Self-pay | Admitting: Endocrinology

## 2017-05-31 DIAGNOSIS — R1032 Left lower quadrant pain: Secondary | ICD-10-CM

## 2017-06-11 ENCOUNTER — Ambulatory Visit: Payer: Commercial Managed Care - HMO | Admitting: Diagnostic Neuroimaging

## 2017-06-12 ENCOUNTER — Ambulatory Visit: Payer: Commercial Managed Care - HMO | Admitting: Neurology

## 2017-08-15 ENCOUNTER — Ambulatory Visit: Payer: 59 | Admitting: Neurology

## 2017-08-15 ENCOUNTER — Encounter: Payer: Self-pay | Admitting: Neurology

## 2017-08-15 VITALS — BP 128/70 | HR 76 | Resp 16 | Ht 66.0 in | Wt 200.8 lb

## 2017-08-15 DIAGNOSIS — I679 Cerebrovascular disease, unspecified: Secondary | ICD-10-CM

## 2017-08-15 NOTE — Progress Notes (Signed)
NEUROLOGY CONSULTATION NOTE  Elizabeth Kent MRN: [id number] DOB: 19-Aug-1960  Referring provider: Dr. Evlyn Kanner Primary care provider: Dr. 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  History supplemented by PCPP and ED notes.  On 12/16/14, she presented to the ED with facial numbness.  Several days prior to visit, she noted numbness and swelling sensation on the right side of her cheek, lip and tongue that progressed to both sides.  She also noted painful nodules on her tongue.  She did not exhibit facial droop, visual disturbance, or unilateral numbness and weakness.  MRI of the brain was performed, which was personally reviewed and demonstrated chronic small vessel ischemic changes with multiple small chronic bilateral cerebellar infarcts but no acute findings.  Symptoms subsequently resolved.  She did follow up with outpatient neurology.  A possible viral syndrome was suggested.  However, the incidental finding of chronic cerebellar strokes was never discussed.    She is not on antiplatelet therapy.  To further investigate, her PCP Dr. Evlyn Kanner ordered testing.  Lipid panel from 04/11/17 showed total cholesterol 263, TG 62, HDL 73 and LDL 178.  Carotid doppler from 04/25/17 showed no hemodynamically significant ICA stenosis with antegrade flow in both vertebral arteries.  TTE from 05/23/17 showed LV EF 60-65% with no cardiac source of emboli.  PAST MEDICAL HISTORY: Past Medical History:  Diagnosis Date  . Numbness and tingling 12/30/2014  . Thyroid disease     PAST SURGICAL HISTORY: Past Surgical History:  Procedure Laterality Date  . ABDOMINAL HYSTERECTOMY    . THYROIDECTOMY      MEDICATIONS: Current Outpatient Medications on File Prior to Visit  Medication Sig Dispense Refill  . cetirizine (ZYRTEC) 10  MG tablet Take 10 mg by mouth daily.    . diphenhydrAMINE (BENADRYL) 25 mg capsule Take 25 mg by mouth every 6 (six) hours as needed.    Marland Kitchen EPINEPHrine 0.3 mg/0.3 mL IJ SOAJ injection Inject 0.3 mg into the muscle once.    Marland Kitchen levothyroxine (SYNTHROID, LEVOTHROID) 112 MCG tablet Take 112 mcg by mouth daily.     No current facility-administered medications on file prior to visit.     ALLERGIES: Allergies  Allergen Reactions  . Other     Nuts   . Penicillins   . Shellfish Allergy     FAMILY HISTORY: Family History  Problem Relation Age of Onset  . Hypertension Mother   . Kidney disease Mother        dialysis  . Cancer Father   . Hypertension Maternal Grandmother     SOCIAL HISTORY: Social History   Socioeconomic History  . Marital status: Single    Spouse name: Not on file  . Number of children: 0  . Years of education: 27  . Highest education level: Not on file  Occupational History  . Occupation: Audiological scientist   Social Needs  . Financial resource strain: Not on file  . Food insecurity:    Worry: Not on file    Inability: Not on file  . Transportation needs:    Medical: Not on file    Non-medical: Not on file  Tobacco Use  . Smoking status: Never Smoker  . Smokeless tobacco: Never Used  Substance and Sexual Activity  . Alcohol use: Yes    Alcohol/week: 0.0 oz  Comment: about 3 times per week.  . Drug use: No  . Sexual activity: Not on file  Lifestyle  . Physical activity:    Days per week: Not on file    Minutes per session: Not on file  . Stress: Not on file  Relationships  . Social connections:    Talks on phone: Not on file    Gets together: Not on file    Attends religious service: Not on file    Active member of club or organization: Not on file    Attends meetings of clubs or organizations: Not on file    Relationship status: Not on file  . Intimate partner violence:    Fear of current or ex partner: Not on file    Emotionally abused: Not on  file    Physically abused: Not on file    Forced sexual activity: Not on file  Other Topics Concern  . Not on file  Social History Narrative   Patient drinks 1 cup of caffeine daily.   Patient is right handed.    REVIEW OF SYSTEMS: Constitutional: No fevers, chills, or sweats, no generalized fatigue, change in appetite Eyes: No visual changes, double vision, eye pain Ear, nose and throat: No hearing loss, ear pain, nasal congestion, sore throat Cardiovascular: No chest pain, palpitations Respiratory:  No shortness of breath at rest or with exertion, wheezes GastrointestinaI: No nausea, vomiting, diarrhea, abdominal pain, fecal incontinence Genitourinary:  No dysuria, urinary retention or frequency Musculoskeletal:  No neck pain, back pain Integumentary: No rash, pruritus, skin lesions Neurological: as above Psychiatric: No depression, insomnia, anxiety Endocrine: No palpitations, fatigue, diaphoresis, mood swings, change in appetite, change in weight, increased thirst Hematologic/Lymphatic:  No purpura, petechiae. Allergic/Immunologic: no itchy/runny eyes, nasal congestion, recent allergic reactions, rashes  PHYSICAL EXAM: Vitals:   08/15/17 1006  BP: 128/70  Pulse: 76  Resp: 16  SpO2: 96%   General: No acute distress.  Patient appears well-groomed.  Head:  Normocephalic/atraumatic Eyes:  fundi examined but not visualized Neck: supple, no paraspinal tenderness, full range of motion Back: No paraspinal tenderness Heart: regular rate and rhythm Lungs: Clear to auscultation bilaterally. Vascular: No carotid bruits. Neurological Exam: Mental status: alert and oriented to person, place, and time, recent and remote memory intact, fund of knowledge intact, attention and concentration intact, speech fluent and not dysarthric, language intact. Cranial nerves: CN I: not tested CN II: pupils equal, round and reactive to light, visual fields intact CN III, IV, VI:  full range of  motion, no nystagmus, no ptosis CN V: facial sensation intact CN VII: upper and lower face symmetric CN VIII: hearing intact CN IX, X: gag intact, uvula midline CN XI: sternocleidomastoid and trapezius muscles intact CN XII: tongue midline Bulk & Tone: normal, no fasciculations. Motor:  5/5 throughout  Sensation: temperature and vibration sensation intact. Deep Tendon Reflexes:  2+ throughout, toes downgoing.  Finger to nose testing:  Without dysmetria.  Heel to shin:  Without dysmetria.  Gait:  Normal station and stride.  Able to turn and tandem walk. Romberg negative.  IMPRESSION: Incidental small chronic cerebellar strokes on brain MRI  PLAN: No further testing warranted. Recommend:  Starting ASA 81mg  daily  Optimize cholesterol control  Continue blood pressure control  Exercise  Mediterranean diet Follow up as needed.  Thank you for allowing me to take part in the care of this patient.  Shon MilletAdam Aisa Schoeppner, DO  CC: Adrian PrinceStephen South, MD

## 2017-08-15 NOTE — Patient Instructions (Signed)
1.  Recommend taking aspirin 81mg  daily 2.  Try to improve cholesterol 3.  Recommend routine exercise 4.  Recommend Mediterranean diet:  Mediterranean Diet A Mediterranean diet refers to food and lifestyle choices that are based on the traditions of countries located on the [employer]. This way of eating has been shown to help prevent certain conditions and improve outcomes for people who have chronic diseases, like kidney disease and heart disease. What are tips for following this plan? Lifestyle  Cook and eat meals together with your family, when possible.  Drink enough fluid to keep your urine clear or pale yellow.  Be physically active every day. This includes: ? Aerobic exercise like running or swimming. ? Leisure activities like gardening, walking, or housework.  Get 7-8 hours of sleep each night.  If recommended by your health care provider, drink red wine in moderation. This means 1 glass a day for nonpregnant women and 2 glasses a day for men. A glass of wine equals 5 oz (150 mL). Reading food labels  Check the serving size of packaged foods. For foods such as rice and pasta, the serving size refers to the amount of cooked product, not dry.  Check the total fat in packaged foods. Avoid foods that have saturated fat or trans fats.  Check the ingredients list for added sugars, such as corn syrup. Shopping  At the grocery store, buy most of your food from the areas near the walls of the store. This includes: ? Fresh fruits and vegetables (produce). ? Grains, beans, nuts, and seeds. Some of these may be available in unpackaged forms or large amounts (in bulk). ? Fresh seafood. ? Poultry and eggs. ? Low-fat dairy products.  Buy whole ingredients instead of prepackaged foods.  Buy fresh fruits and vegetables in-season from local farmers markets.  Buy frozen fruits and vegetables in resealable bags.  If you do not have access to quality fresh seafood, buy precooked  frozen shrimp or canned fish, such as tuna, salmon, or sardines.  Buy small amounts of raw or cooked vegetables, salads, or olives from the deli or salad bar at your store.  Stock your pantry so you always have certain foods on hand, such as olive oil, canned tuna, canned tomatoes, rice, pasta, and beans. Cooking  Cook foods with extra-virgin olive oil instead of using butter or other vegetable oils.  Have meat as a side dish, and have vegetables or grains as your main dish. This means having meat in small portions or adding small amounts of meat to foods like pasta or stew.  Use beans or vegetables instead of meat in common dishes like chili or lasagna.  Experiment with different cooking methods. Try roasting or broiling vegetables instead of steaming or sauteing them.  Add frozen vegetables to soups, stews, pasta, or rice.  Add nuts or seeds for added healthy fat at each meal. You can add these to yogurt, salads, or vegetable dishes.  Marinate fish or vegetables using olive oil, lemon juice, garlic, and fresh herbs. Meal planning  Plan to eat 1 vegetarian meal one day each week. Try to work up to 2 vegetarian meals, if possible.  Eat seafood 2 or more times a week.  Have healthy snacks readily available, such as: ? Vegetable sticks with hummus. ? [place] yogurt. ? Fruit and nut trail mix.  Eat balanced meals throughout the week. This includes: ? Fruit: 2-3 servings a day ? Vegetables: 4-5 servings a day ? Low-fat dairy: 2 servings  a day ? Fish, poultry, or lean meat: 1 serving a day ? Beans and legumes: 2 or more servings a week ? Nuts and seeds: 1-2 servings a day ? Whole grains: 6-8 servings a day ? Extra-virgin olive oil: 3-4 servings a day  Limit red meat and sweets to only a few servings a month What are my food choices?  Mediterranean diet ? Recommended ? Grains: Whole-grain pasta. Brown rice. Bulgar wheat. Polenta. Couscous. Whole-wheat bread. Orpah Cobbatmeal.  Quinoa. ? Vegetables: Artichokes. Beets. Broccoli. Cabbage. Carrots. Eggplant. Green beans. Chard. Kale. Spinach. Onions. Leeks. Peas. Squash. Tomatoes. Peppers. Radishes. ? Fruits: Apples. Apricots. Avocado. Berries. Bananas. Cherries. Dates. Figs. Grapes. Lemons. Melon. Oranges. Peaches. Plums. Pomegranate. ? Meats and other protein foods: Beans. Almonds. Sunflower seeds. Pine nuts. Peanuts. Cod. Salmon. Scallops. Shrimp. Tuna. Tilapia. Clams. Oysters. Eggs. ? Dairy: Low-fat milk. Cheese. Greek yogurt. ? Beverages: Water. Red wine. Herbal tea. ? Fats and oils: Extra virgin olive oil. Avocado oil. Grape seed oil. ? Sweets and desserts: [place] yogurt with honey. Baked apples. Poached pears. Trail mix. ? Seasoning and other foods: Basil. Cilantro. Coriander. Cumin. Mint. Parsley. Sage. Rosemary. Tarragon. Garlic. Oregano. Thyme. Pepper. Balsalmic vinegar. Tahini. Hummus. Tomato sauce. Olives. Mushrooms. ? Limit these ? Grains: Prepackaged pasta or rice dishes. Prepackaged cereal with added sugar. ? Vegetables: Deep fried potatoes (french fries). ? Fruits: Fruit canned in syrup. ? Meats and other protein foods: Beef. Pork. Lamb. Poultry with skin. Hot dogs. Tomasa BlaseBacon. ? Dairy: Ice cream. Sour cream. Whole milk. ? Beverages: Juice. Sugar-sweetened soft drinks. Beer. Liquor and spirits. ? Fats and oils: Butter. Canola oil. Vegetable oil. Beef fat (tallow). Lard. ? Sweets and desserts: Cookies. Cakes. Pies. Candy. ? Seasoning and other foods: Mayonnaise. Premade sauces and marinades. ? The items listed may not be a complete list. Talk with your dietitian about what dietary choices are right for you. Summary  The Mediterranean diet includes both food and lifestyle choices.  Eat a variety of fresh fruits and vegetables, beans, nuts, seeds, and whole grains.  Limit the amount of red meat and sweets that you eat.  Talk with your health care provider about whether it is safe for you to drink red wine in  moderation. This means 1 glass a day for nonpregnant women and 2 glasses a day for men. A glass of wine equals 5 oz (150 mL). This information is not intended to replace advice given to you by your health care provider. Make sure you discuss any questions you have with your health care provider. Document Released: 12/21/2015 Document Revised: 01/23/2016 Document Reviewed: 12/21/2015 Elsevier Interactive Patient Education  [employer]2018 Elsevier Inc.

## 2017-08-22 ENCOUNTER — Telehealth: Payer: Self-pay | Admitting: Neurology

## 2017-08-22 NOTE — Telephone Encounter (Signed)
Called and spoke with Pt, advsd her Dr Everlena CooperJaffe would suggest [facility]. I advsd her Dr. Reather LittlerAjay Kumar is there.

## 2017-08-22 NOTE — Telephone Encounter (Signed)
Patient called and would like a referral to a endocrinologist  provider

## 2023-01-30 ENCOUNTER — Encounter (HOSPITAL_COMMUNITY): Payer: Self-pay | Admitting: Emergency Medicine

## 2023-01-30 ENCOUNTER — Emergency Department (HOSPITAL_COMMUNITY): Payer: Self-pay

## 2023-01-30 ENCOUNTER — Emergency Department (HOSPITAL_COMMUNITY)
Admission: EM | Admit: 2023-01-30 | Discharge: 2023-01-30 | Disposition: A | Discharge status: Home / Self Care | Payer: BC Managed Care – PPO | Attending: Emergency Medicine | Admitting: Emergency Medicine

## 2023-01-30 ENCOUNTER — Other Ambulatory Visit: Payer: Self-pay

## 2023-01-30 DIAGNOSIS — R002 Palpitations: Secondary | ICD-10-CM | POA: Insufficient documentation

## 2023-01-30 DIAGNOSIS — R42 Dizziness and giddiness: Secondary | ICD-10-CM | POA: Insufficient documentation

## 2023-01-30 LAB — BASIC METABOLIC PANEL
Anion gap: 10 (ref 5–15)
BUN: 13 mg/dL (ref 6–20)
CO2: 25 mmol/L (ref 22–32)
Calcium: 8.9 mg/dL (ref 8.9–10.3)
Chloride: 100 mmol/L (ref 98–111)
Creatinine, Ser: 0.95 mg/dL (ref 0.44–1.00)
GFR, Estimated: >60 mL/min (ref >60)
Glucose, Bld: 106 mg/dL — ABNORMAL HIGH (ref 70–99)
Potassium: 3.9 mmol/L (ref 3.5–5.1)
Sodium: 135 mmol/L (ref 135–145)

## 2023-01-30 LAB — CBC
HCT: 43.4 % (ref 36.0–46.0)
Hemoglobin: 13.7 g/dL (ref 12.0–15.0)
MCH: 27.5 pg (ref 26.0–34.0)
MCHC: 31.6 g/dL (ref 30.0–36.0)
MCV: 87.1 fL (ref 80.0–100.0)
Platelets: 278 10*3/uL (ref 150–400)
RBC: 4.98 MIL/uL (ref 3.87–5.11)
RDW: 15 % (ref 11.5–15.5)
WBC: 5.5 10*3/uL (ref 4.0–10.5)
nRBC: 0 % (ref 0.0–0.2)

## 2023-01-30 LAB — TROPONIN I (HIGH SENSITIVITY)
Troponin I (High Sensitivity): 4 ng/L (ref <18)
Troponin I (High Sensitivity): 4 ng/L (ref <18)

## 2023-01-30 LAB — D-DIMER, QUANTITATIVE: D-Dimer, Quant: <0.27 ug/mL-FEU (ref 0.00–0.50)

## 2023-01-30 NOTE — ED Provider Triage Note (Signed)
Emergency Medicine Provider Triage Evaluation Note  Elizabeth Kent , a 62 y.o. female  was evaluated in triage.  Pt complains of light-headedness and palpitations.  This morning, patient felt lightheaded and thought her blood sugar was low, but she did not feel better after eating.  She states she felt like her heart was racing and had pain in her right shoulder.  Yesterday she had pain in left side of her face and left thigh.  Endorses some shortness of breath earlier.  Denies history of diabetes, heart problems.   Review of Systems  Positive: As above Negative: As above  Physical Exam  BP (!) 145/85 (BP Location: Left Arm)   Pulse 79   Temp 97.9 F (36.6 C) (Oral)   Resp 16   SpO2 100%  Gen:   Awake, no distress   Resp:  Normal effort  MSK:   Moves extremities without difficulty  Other:    Medical Decision Making  Medically screening exam initiated at 12:30 PM.  Appropriate orders placed.  Elizabeth Kent was informed that the remainder of the evaluation will be completed by another provider, this initial triage assessment does not replace that evaluation, and the importance of remaining in the ED until their evaluation is complete.     Melton Alar R, PA-C 01/30/23 1231

## 2023-01-30 NOTE — ED Provider Notes (Signed)
[facility] EMERGENCY DEPARTMENT AT [facility] Provider Note  CSN: [id number] Arrival date & time: 01/30/23 1111  Chief Complaint(s) Palpitations and Weakness  HPI Elizabeth Kent is a 62 y.o. female without significant past medical history presenting to the emergency department with lightheadedness.  Patient reports this morning had some lightheadedness, maybe some right shoulder pain and palpitations.  Also had some shortness of breath.  Symptoms have improved since then but she still feels mildly lightheaded.  No dizziness.  Chest pain, syncope.  Does report travel in the past week to [place].  No cough, fevers or chills.  No urinary symptoms, abdominal pain.  No nausea, vomiting.  Was concerned it could be due to low blood sugar but she does not take insulin or any other diabetic medications.   Past Medical History Past Medical History:  Diagnosis Date   Numbness and tingling 12/30/2014   Thyroid disease    Patient Active Problem List   Diagnosis Date Noted   Numbness and tingling 12/30/2014   Home Medication(s) Prior to Admission medications   Medication Sig Start Date End Date Taking? Authorizing Provider  cetirizine (ZYRTEC) 10 MG tablet Take 10 mg by mouth daily.    [provider]  diphenhydrAMINE (BENADRYL) 25 mg capsule Take 25 mg by mouth every 6 (six) hours as needed.    [provider]  EPINEPHrine 0.3 mg/0.3 mL IJ SOAJ injection Inject 0.3 mg into the muscle once.    [provider]  levothyroxine (SYNTHROID, LEVOTHROID) 112 MCG tablet Take 112 mcg by mouth daily. 12/16/14   [provider]                                                                                                                                    Past Surgical History Past Surgical History:  Procedure Laterality Date   ABDOMINAL HYSTERECTOMY     THYROIDECTOMY     Family History Family History  Problem Relation Age of Onset   Hypertension Mother     Kidney disease Mother        dialysis   Cancer Father    Hypertension Maternal Grandmother     Social History Social History   Tobacco Use   Smoking status: Never   Smokeless tobacco: Never  Vaping Use   Vaping status: Never Used  Substance Use Topics   Alcohol use: Yes    Alcohol/week: 0.0 standard drinks of alcohol    Comment: about 3 times per week.   Drug use: No   Allergies Other, Penicillins, and Shellfish allergy  Review of Systems Review of Systems  All other systems reviewed and are negative.   Physical Exam Vital Signs  I have reviewed the triage vital signs BP (!) 140/77   Pulse 60   Temp 98 F (36.7 C) (Oral)   Resp 16   SpO2 100%  Physical Exam Vitals and nursing note reviewed.  Constitutional:      General: She is not in acute distress.    Appearance: She is well-developed.  HENT:     Head: Normocephalic and atraumatic.     Mouth/Throat:     Mouth: Mucous membranes are moist.  Eyes:     Pupils: Pupils are equal, round, and reactive to light.  Cardiovascular:     Rate and Rhythm: Normal rate and regular rhythm.     Heart sounds: No murmur heard. Pulmonary:     Effort: Pulmonary effort is normal. No respiratory distress.     Breath sounds: Normal breath sounds.  Abdominal:     General: Abdomen is flat.     Palpations: Abdomen is soft.     Tenderness: There is no abdominal tenderness.  Musculoskeletal:        General: No tenderness.     Right lower leg: No edema.     Left lower leg: No edema.  Skin:    General: Skin is warm and dry.  Neurological:     General: No focal deficit present.     Mental Status: She is alert. Mental status is at baseline.  Psychiatric:        Mood and Affect: Mood normal.        Behavior: Behavior normal.     ED Results and Treatments Labs (all labs ordered are listed, but only abnormal results are displayed) Labs Reviewed  BASIC METABOLIC PANEL - Abnormal; Notable for the following components:       Result Value   Glucose, Bld 106 (*)    All other components within normal limits  CBC  D-DIMER, QUANTITATIVE  TROPONIN I (HIGH SENSITIVITY)  TROPONIN I (HIGH SENSITIVITY)                                                                                                                          Radiology DG Chest 2 View  Result Date: 01/30/2023 CLINICAL DATA:  Shoulder pain EXAM: CHEST - 2 VIEW COMPARISON:  None Available. FINDINGS: The heart size and mediastinal contours are within normal limits. No consolidation, pneumothorax or effusion. No edema. The visualized skeletal structures are unremarkable. Overlapping cardiac leads. Mild degenerative changes seen of the spine on lateral view. IMPRESSION: No acute cardiopulmonary disease. Electronically Signed   By: Karen Kays M.D.   On: 01/30/2023 14:06    Pertinent labs & imaging results that were available during my care of the patient were reviewed by me and considered in my medical decision making (see MDM for details).  Medications Ordered in ED Medications - No data to display  Procedures Procedures  (including critical care time)  Medical Decision Making / ED Course   MDM:  62 year old presenting to the emergency department with episode of lightheadedness earlier today.  Patient well-appearing, vital signs reassuring.  Physical exam with no focal abnormality.  Unclear cause of symptoms, or concern for cardiac cause, will check troponin.  Did have recent travel, lower concern for PE with will check D-dimer.  Patient denies any infectious symptoms such as fevers or chills, chest x-ray with no pneumonia.  Patient was worried about low blood sugar but her blood sugar here is normal.  She is also not on insulin or anything that would lower her blood sugar.  Labs with no anemia.  Unclear cause of her symptoms.   Will reassess.  If workup is negative, likely discharge.  Clinical Course as of 01/30/23 2154  Thu Jan 30, 2023  2153 Workup reassuring.  Troponin negative x 2.  D-dimer negative.  Labs otherwise reassuring.  Patient still feels well.  Offered cardiology follow-up but patient reports she thinks she had a stress test in the last year that was normal.  Advised follow-up with primary care physician. Will discharge patient to home. All questions answered. Patient comfortable with plan of discharge. Return precautions discussed with patient and specified on the after visit summary.  [WS]    Clinical Course User Index [WS] Lonell Grandchild, MD     Additional history obtained: -External records from outside source obtained and reviewed including: Chart review including previous notes, labs, imaging, consultation notes including PMD notes   Lab Tests: -I ordered, reviewed, and interpreted labs.   The pertinent results include:   Labs Reviewed  BASIC METABOLIC PANEL - Abnormal; Notable for the following components:      Result Value   Glucose, Bld 106 (*)    All other components within normal limits  CBC  D-DIMER, QUANTITATIVE  TROPONIN I (HIGH SENSITIVITY)  TROPONIN I (HIGH SENSITIVITY)    Notable for borderline hypoglycemia   EKG   EKG Interpretation Date/Time:  Thursday January 30 2023 11:27:48 EDT Ventricular Rate:  71 PR Interval:  179 QRS Duration:  86 QT Interval:  520 QTC Calculation: 566 R Axis:   84  Text Interpretation: Sinus rhythm Borderline right axis deviation Nonspecific T abnormalities, lateral leads Prolonged QT interval Compared to previous tracing Nonspecific ST abnormality is similar Confirmed by Alvino Blood ([id number]) on 01/30/2023 6:39:15 PM         Imaging Studies ordered: I ordered imaging studies including CXR On my interpretation imaging demonstrates no acute process I independently visualized and interpreted imaging. I agree with the  radiologist interpretation   Medicines ordered and prescription drug management: No orders of the defined types were placed in this encounter.   -I have reviewed the patients home medicines and have made adjustments as needed    Cardiac Monitoring: The patient was maintained on a cardiac monitor.  I personally viewed and interpreted the cardiac monitored which showed an underlying rhythm of: NSR  Social Determinants of Health:  Diagnosis or treatment significantly limited by social determinants of health: obesity   Reevaluation: After the interventions noted above, I reevaluated the patient and found that their symptoms have improved  Co morbidities that complicate the patient evaluation  Past Medical History:  Diagnosis Date   Numbness and tingling 12/30/2014   Thyroid disease       Dispostion: Disposition decision including need for hospitalization was considered, and patient discharged from emergency department.  Final Clinical Impression(s) / ED Diagnoses Final diagnoses:  Lightheadedness     This chart was dictated using voice recognition software.  Despite best efforts to proofread,  errors can occur which can change the documentation meaning.    Lonell Grandchild, MD 01/30/23 2154

## 2023-01-30 NOTE — ED Triage Notes (Addendum)
This am the patient felt lightheaded and thought her blood sugar was low. She ate and didn't fell better. In fact she experienced heart racing, chills and pain in the right shoulder blade. The other day she had a pain in the left jaw, and in the left leg. A few moths ago she had an episode of syncope. Patient's grandmother died of a heart attack.

## 2023-01-30 NOTE — Discharge Instructions (Signed)
We evaluated you in the emergency department for your lightheadedness.  Your laboratory testing was reassuring including your cardiac enzymes.  We did not see any dangerous cause of your symptoms.  Please follow-up closely with your primary care doctor.  Please keep an eye on your symptoms.  If you do notice any new or worsening symptoms such as severe pain, chest pain, fainting, fevers or chills, abdominal pain, or any other new or concerning symptoms, please return to the emergency department for reassessment.

## 2023-11-17 ENCOUNTER — Encounter: Payer: Self-pay | Admitting: Sports Medicine

## 2023-11-17 ENCOUNTER — Other Ambulatory Visit (INDEPENDENT_AMBULATORY_CARE_PROVIDER_SITE_OTHER): Payer: Self-pay

## 2023-11-17 ENCOUNTER — Ambulatory Visit (INDEPENDENT_AMBULATORY_CARE_PROVIDER_SITE_OTHER): Admitting: Sports Medicine

## 2023-11-17 DIAGNOSIS — M25462 Effusion, left knee: Secondary | ICD-10-CM

## 2023-11-17 DIAGNOSIS — M25474 Effusion, right foot: Secondary | ICD-10-CM

## 2023-11-17 DIAGNOSIS — M25475 Effusion, left foot: Secondary | ICD-10-CM

## 2023-11-17 DIAGNOSIS — M1712 Unilateral primary osteoarthritis, left knee: Secondary | ICD-10-CM | POA: Diagnosis not present

## 2023-11-17 DIAGNOSIS — S93492A Sprain of other ligament of left ankle, initial encounter: Secondary | ICD-10-CM | POA: Diagnosis not present

## 2023-11-17 DIAGNOSIS — G8929 Other chronic pain: Secondary | ICD-10-CM

## 2023-11-17 DIAGNOSIS — M25472 Effusion, left ankle: Secondary | ICD-10-CM

## 2023-11-17 DIAGNOSIS — M25562 Pain in left knee: Secondary | ICD-10-CM

## 2023-11-17 DIAGNOSIS — M25471 Effusion, right ankle: Secondary | ICD-10-CM | POA: Diagnosis not present

## 2023-11-17 MED ORDER — MELOXICAM 15 MG PO TABS: 15.0000 mg | ORAL_TABLET | Freq: Every day | ORAL | 0 refills | Status: AC

## 2023-11-17 NOTE — Progress Notes (Unsigned)
 Elizabeth Kent - 63 y.o. female MRN [id number]  Date of birth: [birth year]  Office Visit Note: Visit Date: 11/17/2023 PCP: Nichole Senior, MD Referred by: Verta Izetta HERO, NP  Subjective: Chief Complaint  Patient presents with   Left Knee - Pain   HPI: Elizabeth Kent is a pleasant 63 y.o. female who presents today for left knee pain and swelling, as well as bilateral ankle discomfort/swelling.  Left knee and ankles -she recently returned from a trip on 24 June and noted she had quite a bit of swelling in the left knee with associated pain.  She also had swelling in both of her ankles, but that this may be due to a lot of travel and walking.  She does have some pain over the medial knee joint but more so anteriorly.  She does feel some restriction with end range of motion of the left knee.  She denies any previous injury to this.  She did take 2 doses of ibuprofen and elevating her leg which did help with the swelling.  She also purchased a copper fit compressive knee sleeve, she is inquiring if this is helpful.  She does note some crepitus, but denies any give way, locking or catching about the knee.  She enjoys being active and working out, she is wondering when she can return to this safely.  Pertinent ROS were reviewed with the patient and found to be negative unless otherwise specified above in HPI.   Assessment & Plan: Visit Diagnoses:  1. Unilateral primary osteoarthritis, left knee   2. Pain and swelling of left knee   3. Sprain of anterior talofibular ligament of left ankle, initial encounter   4. Bilateral swelling of feet and ankles    Plan: Impression is moderate knee osteoarthritis with exacerbation given her recent trip with walking and traveling.  She does have an associated effusion on the knee joint.  I do not believe this is large enough to require aspiration, but we discussed anti-inflammatory use, ice versus injection therapy to help improve her swelling and subsequent  pain.  She would like to move forward with a trial of anti-inflammatories first.  Will start meloxicam  15 mg for at least 2 weeks consistently, may take 1/3-week if needed.  She may ice as well after activity.  Recommend a neoprene compression sleeve for the knee.  I would like her to reduce her swelling this week and then starting next Monday slowly return to physical activity.  She did have an inversion ankle injury of the left side, exam points to the ATFL being her origin of pain.  She does have pain with resisted inversion/eversion but no gross laxity or give way, suggestive of ankle sprain versus acute tear. She had some swelling in the ankles which I believe is more from her travel as opposed to true pathology.  May elevate at the end of the day if needed.  She is leaving for another trip in early August, I will see her back a few days before this to reevaluate.  If she does not have resolution of her effusion and/or pain, could consider injection in future (?aspiration but doubt).  Follow-up: Return in about 22 days (around 12/09/2023) for For L-knee f/u .   Meds & Orders:  Meds ordered this encounter  Medications   meloxicam  (MOBIC ) 15 MG tablet    Sig: Take 1 tablet (15 mg total) by mouth daily.    Dispense:  21 tablet    Refill:  0    Orders Placed This Encounter  Procedures   XR Knee Complete 4 Views Left     Procedures: No procedures performed      Clinical History: No specialty comments available.  She reports that she has never smoked. She has never used smokeless tobacco. No results for input(s): HGBA1C, LABURIC in the last 8760 hours.  Objective:    Physical Exam  Gen: Well-appearing, in no acute distress; non-toxic CV: Well-perfused. Warm.  Resp: Breathing unlabored on room air; no wheezing. Psych: Fluid speech in conversation; appropriate affect; normal thought process  Ortho Exam - Left knee: There is at least a small effusion noted on the left knee, no  effusion on the right knee.  Mild tenderness over the medial joint line and with patellar compression test.  Range of motion is 0-130 degrees compared to 0-135 degrees of the contralateral knee.  There is pain at endrange flexion.  Negative patellar apprehension.  Negative McMurray's testing.  Anterior/posterior drawer are intact.  - Bilateral ankles: There is no significant pitting edema of either ankle today.  Positive TTP palpating over the area of the ATFL with associated pain with resisted inversion/eversion although no gross laxity noted.  Imaging: XR Knee Complete 4 Views Left Result Date: 11/18/2023 4 views of the left knee including AP standing, Rosenberg, lateral and sunrise view was ordered and reviewed by myself today.  X-rays demonstrate moderate medial tibiofemoral joint space narrowing with early arthritic change.  There is mild to moderate patellofemoral arthralgia as well with a mild degree of bony sclerosis over the inferior patella.  Fabella noted.  Likely a small to moderate effusion on the knee noted on lateral view.  No acute fracture.   Past Medical/Family/Surgical/Social History: Medications & Allergies reviewed per EMR, new medications updated. Patient Active Problem List   Diagnosis Date Noted   Numbness and tingling 12/30/2014   Past Medical History:  Diagnosis Date   Numbness and tingling 12/30/2014   Thyroid  disease    Family History  Problem Relation Age of Onset   Hypertension Mother    Kidney disease Mother        dialysis   Cancer Father    Hypertension Maternal Grandmother    Past Surgical History:  Procedure Laterality Date   ABDOMINAL HYSTERECTOMY     THYROIDECTOMY     Social History   Occupational History   Occupation: Audiological scientist   Tobacco Use   Smoking status: Never   Smokeless tobacco: Never  Vaping Use   Vaping status: Never Used  Substance and Sexual Activity   Alcohol use: Yes    Alcohol/week: 0.0 standard drinks of alcohol     Comment: about 3 times per week.   Drug use: No   Sexual activity: Not on file

## 2023-11-17 NOTE — Progress Notes (Unsigned)
 Patient says that she went on a trip recently, and when she got home on the 24th of June had only had some noticeable swelling in the ankles and the left knee. She says that she worked out on the elliptical that weekend and had no pain or discomfort. She later noticed swelling in the left knee and left ankle. She says that she did roll her ankle while on her trip, but denies any pop or significant pain from that. She took Ibuprofen and elevated her left leg, which did help with the swelling. She also bought a knee brace which is helpful. She has had some aching in the left knee as well as popping in the past, although this popping has never been painful. She would like to be active and work out, but does not want to further injure the knee. She denies any history of injury; she ran track in high school.

## 2023-11-18 ENCOUNTER — Encounter: Payer: Self-pay | Admitting: Sports Medicine

## 2023-12-09 ENCOUNTER — Ambulatory Visit: Admitting: Sports Medicine

## 2023-12-09 ENCOUNTER — Other Ambulatory Visit: Payer: Self-pay

## 2023-12-09 DIAGNOSIS — M25475 Effusion, left foot: Secondary | ICD-10-CM

## 2023-12-09 DIAGNOSIS — M25471 Effusion, right ankle: Secondary | ICD-10-CM

## 2023-12-09 DIAGNOSIS — M1712 Unilateral primary osteoarthritis, left knee: Secondary | ICD-10-CM | POA: Diagnosis not present

## 2023-12-09 DIAGNOSIS — M25562 Pain in left knee: Secondary | ICD-10-CM

## 2023-12-09 DIAGNOSIS — M25462 Effusion, left knee: Secondary | ICD-10-CM | POA: Diagnosis not present

## 2023-12-09 DIAGNOSIS — M25474 Effusion, right foot: Secondary | ICD-10-CM

## 2023-12-09 DIAGNOSIS — M25472 Effusion, left ankle: Secondary | ICD-10-CM

## 2023-12-09 NOTE — Progress Notes (Unsigned)
 Patient says that she finished the Meloxicam  on Sunday. She says that the knee is not painful anymore, although she is unsure if the swelling has come down. She also mentions swelling in both of the ankles in the evenings. She is asking whether this swelling may be a side effect of the Meloxicam .

## 2023-12-09 NOTE — Progress Notes (Unsigned)
   Elizabeth Kent - 63 y.o. female MRN [id number]  Date of birth: 10-Jun-1960  Office Visit Note: Visit Date: 12/09/2023 PCP: Nichole Senior, MD Referred by: Nichole Senior, MD  Subjective: Chief Complaint  Patient presents with  . Left Knee - Follow-up   HPI: Elizabeth Kent is a pleasant 63 y.o. female who presents today for follow-up of left knee pain and swelling.  ***  Pertinent ROS were reviewed with the patient and found to be negative unless otherwise specified above in HPI.   Assessment & Plan: Visit Diagnoses: No diagnosis found.  Plan: ***  Follow-up: No follow-ups on file.   Meds & Orders: No orders of the defined types were placed in this encounter.  No orders of the defined types were placed in this encounter.    Procedures: No procedures performed      Clinical History: No specialty comments available.  She reports that she has never smoked. She has never used smokeless tobacco. No results for input(s): HGBA1C, LABURIC in the last 8760 hours.  Objective:   Vital Signs: There were no vitals taken for this visit.  Physical Exam  Gen: Well-appearing, in no acute distress; non-toxic CV: Well-perfused. Warm.  Resp: Breathing unlabored on room air; no wheezing. Psych: Fluid speech in conversation; appropriate affect; normal thought process  Ortho Exam - ***  Imaging: No results found.  Past Medical/Family/Surgical/Social History: Medications & Allergies reviewed per EMR, new medications updated. Patient Active Problem List   Diagnosis Date Noted  . Numbness and tingling 12/30/2014   Past Medical History:  Diagnosis Date  . Numbness and tingling 12/30/2014  . Thyroid  disease    Family History  Problem Relation Age of Onset  . Hypertension Mother   . Kidney disease Mother        dialysis  . Cancer Father   . Hypertension Maternal Grandmother    Past Surgical History:  Procedure Laterality Date  . ABDOMINAL HYSTERECTOMY    .  THYROIDECTOMY     Social History   Occupational History  . Occupation: accounting   Tobacco Use  . Smoking status: Never  . Smokeless tobacco: Never  Vaping Use  . Vaping status: Never Used  Substance and Sexual Activity  . Alcohol use: Yes    Alcohol/week: 0.0 standard drinks of alcohol    Comment: about 3 times per week.  . Drug use: No  . Sexual activity: Not on file

## 2023-12-10 ENCOUNTER — Encounter: Payer: Self-pay | Admitting: Sports Medicine
